# Patient Record
Sex: Male | Born: 1960
Health system: Southern US, Community
[De-identification: ages and names within clinical notes are randomized; demographics above are authoritative.]

## PROBLEM LIST (undated history)

## (undated) DIAGNOSIS — Z789 Other specified health status: Secondary | ICD-10-CM

## (undated) HISTORY — PX: APPENDECTOMY: SHX54

---

## 2013-09-26 ENCOUNTER — Emergency Department: Payer: Self-pay | Admitting: Emergency Medicine

## 2013-09-27 LAB — COMPREHENSIVE METABOLIC PANEL
Albumin: 3.5 g/dL (ref 3.4–5.0)
Anion Gap: 8 (ref 7–16)
Bilirubin,Total: 0.3 mg/dL (ref 0.2–1.0)
Calcium, Total: 8.3 mg/dL — ABNORMAL LOW (ref 8.5–10.1)
Chloride: 112 mmol/L — ABNORMAL HIGH (ref 98–107)
EGFR (African American): >60
EGFR (Non-African Amer.): >60
Glucose: 83 mg/dL (ref 65–99)
Osmolality: 280 (ref 275–301)
Potassium: 3.6 mmol/L (ref 3.5–5.1)
SGOT(AST): 84 U/L — ABNORMAL HIGH (ref 15–37)
SGPT (ALT): 78 U/L (ref 12–78)
Total Protein: 7 g/dL (ref 6.4–8.2)

## 2013-09-27 LAB — CBC
HCT: 43.3 % (ref 40.0–52.0)
HGB: 15.3 g/dL (ref 13.0–18.0)
MCH: 32.1 pg (ref 26.0–34.0)
MCV: 91 fL (ref 80–100)
Platelet: 235 10*3/uL (ref 150–440)
RBC: 4.75 10*6/uL (ref 4.40–5.90)
RDW: 12.3 % (ref 11.5–14.5)
WBC: 5.7 10*3/uL (ref 3.8–10.6)

## 2013-09-27 LAB — ETHANOL
Ethanol %: 0.218 % — ABNORMAL HIGH (ref 0.000–0.080)
Ethanol: 218 mg/dL

## 2016-01-21 ENCOUNTER — Emergency Department: Payer: Self-pay

## 2016-01-21 ENCOUNTER — Emergency Department
Admission: EM | Admit: 2016-01-21 | Discharge: 2016-01-21 | Disposition: A | Discharge status: Home / Self Care | Payer: Self-pay | Attending: Emergency Medicine | Admitting: Emergency Medicine

## 2016-01-21 ENCOUNTER — Encounter: Payer: Self-pay | Admitting: Emergency Medicine

## 2016-01-21 DIAGNOSIS — Y92410 Unspecified street and highway as the place of occurrence of the external cause: Secondary | ICD-10-CM | POA: Insufficient documentation

## 2016-01-21 DIAGNOSIS — S0990XA Unspecified injury of head, initial encounter: Secondary | ICD-10-CM | POA: Insufficient documentation

## 2016-01-21 DIAGNOSIS — F17200 Nicotine dependence, unspecified, uncomplicated: Secondary | ICD-10-CM | POA: Insufficient documentation

## 2016-01-21 DIAGNOSIS — M87059 Idiopathic aseptic necrosis of unspecified femur: Secondary | ICD-10-CM | POA: Insufficient documentation

## 2016-01-21 DIAGNOSIS — Y9389 Activity, other specified: Secondary | ICD-10-CM | POA: Insufficient documentation

## 2016-01-21 DIAGNOSIS — Y998 Other external cause status: Secondary | ICD-10-CM | POA: Insufficient documentation

## 2016-01-21 DIAGNOSIS — S3993XA Unspecified injury of pelvis, initial encounter: Secondary | ICD-10-CM | POA: Insufficient documentation

## 2016-01-21 DIAGNOSIS — S79912A Unspecified injury of left hip, initial encounter: Secondary | ICD-10-CM | POA: Insufficient documentation

## 2016-01-21 DIAGNOSIS — S8012XA Contusion of left lower leg, initial encounter: Secondary | ICD-10-CM | POA: Insufficient documentation

## 2016-01-21 MED ORDER — IBUPROFEN 800 MG PO TABS: 800.0000 mg | ORAL_TABLET | Freq: Three times a day (TID) | ORAL | Status: DC | PRN

## 2016-01-21 MED ORDER — OXYCODONE-ACETAMINOPHEN 5-325 MG PO TABS
1.0000 | ORAL_TABLET | Freq: Once | ORAL | Status: AC
  Administered 2016-01-21: 1 via ORAL
  Filled 2016-01-21: qty 1

## 2016-01-21 MED ORDER — IBUPROFEN 800 MG PO TABS
800.0000 mg | ORAL_TABLET | Freq: Once | ORAL | Status: AC
  Administered 2016-01-21: 800 mg via ORAL
  Filled 2016-01-21: qty 1

## 2016-01-21 MED ORDER — TRAMADOL HCL 50 MG PO TABS: 50.0000 mg | ORAL_TABLET | Freq: Four times a day (QID) | ORAL | Status: DC | PRN

## 2016-01-21 MED ORDER — CYCLOBENZAPRINE HCL 10 MG PO TABS: 10.0000 mg | ORAL_TABLET | Freq: Three times a day (TID) | ORAL | Status: AC | PRN
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
## 2018-12-28 ENCOUNTER — Encounter: Payer: Self-pay | Admitting: Student

## 2019-01-03 ENCOUNTER — Other Ambulatory Visit: Payer: Self-pay

## 2019-01-03 DIAGNOSIS — Z1211 Encounter for screening for malignant neoplasm of colon: Secondary | ICD-10-CM

## 2019-01-17 ENCOUNTER — Other Ambulatory Visit: Payer: Self-pay

## 2019-01-17 ENCOUNTER — Encounter: Payer: Self-pay | Admitting: Anesthesiology

## 2019-01-17 ENCOUNTER — Telehealth: Payer: Self-pay | Admitting: Gastroenterology

## 2019-01-17 ENCOUNTER — Encounter: Payer: Self-pay | Admitting: *Deleted

## 2019-01-17 NOTE — Telephone Encounter (Signed)
Please call patient and go over instructions for the colonoscopy scheduled for 01-24-2019 with Dr Hoy FinlayaHALIANI.

## 2019-01-18 NOTE — Telephone Encounter (Signed)
Returned patients call to review his colonoscopy instructions.  He specifically asked about nutrition.  I asked him to pull out his instruction packet he received in the mail and  refer to the "Low Fiber Diet" page of his instructions and we will review it together.  He stopped talking to me...  I wanted to confirm his address if he did not receive the instructions but he never said a word our call was dropped.  Thanks [employer]

## 2019-01-23 NOTE — Discharge Instructions (Signed)
General Anesthesia, Adult, Care After  This sheet gives you information about how to care for yourself after your procedure. Your health care provider may also give you more specific instructions. If you have problems or questions, contact your health care provider.  What can I expect after the procedure?  After the procedure, the following side effects are common:  Pain or discomfort at the IV site.  Nausea.  Vomiting.  Sore throat.  Trouble concentrating.  Feeling cold or chills.  Weak or tired.  Sleepiness and fatigue.  Soreness and body aches. These side effects can affect parts of the body that were not involved in surgery.  Follow these instructions at home:    For at least 24 hours after the procedure:  Have a responsible adult stay with you. It is important to have someone help care for you until you are awake and alert.  Rest as needed.  Do not:  Participate in activities in which you could fall or become injured.  Drive.  Use heavy machinery.  Drink alcohol.  Take sleeping pills or medicines that cause drowsiness.  Make important decisions or sign legal documents.  Take care of children on your own.  Eating and drinking  Follow any instructions from your health care provider about eating or drinking restrictions.  When you feel hungry, start by eating small amounts of foods that are soft and easy to digest (bland), such as toast. Gradually return to your regular diet.  Drink enough fluid to keep your urine pale yellow.  If you vomit, rehydrate by drinking water, juice, or clear broth.  General instructions  If you have sleep apnea, surgery and certain medicines can increase your risk for breathing problems. Follow instructions from your health care provider about wearing your sleep device:  Anytime you are sleeping, including during daytime naps.  While taking prescription pain medicines, sleeping medicines, or medicines that make you drowsy.  Return to your normal activities as told by your health care  provider. Ask your health care provider what activities are safe for you.  Take over-the-counter and prescription medicines only as told by your health care provider.  If you smoke, do not smoke without supervision.  Keep all follow-up visits as told by your health care provider. This is important.  Contact a health care provider if:  You have nausea or vomiting that does not get better with medicine.  You cannot eat or drink without vomiting.  You have pain that does not get better with medicine.  You are unable to pass urine.  You develop a skin rash.  You have a fever.  You have redness around your IV site that gets worse.  Get help right away if:  You have difficulty breathing.  You have chest pain.  You have blood in your urine or stool, or you vomit blood.  Summary  After the procedure, it is common to have a sore throat or nausea. It is also common to feel tired.  Have a responsible adult stay with you for the first 24 hours after general anesthesia. It is important to have someone help care for you until you are awake and alert.  When you feel hungry, start by eating small amounts of foods that are soft and easy to digest (bland), such as toast. Gradually return to your regular diet.  Drink enough fluid to keep your urine pale yellow.  Return to your normal activities as told by your health care provider. Ask your health care   provider what activities are safe for you.  This information is not intended to replace advice given to you by your health care provider. Make sure you discuss any questions you have with your health care provider.  Document Released: 03/08/2001 Document Revised: 07/16/2017 Document Reviewed: 07/16/2017  Elsevier Interactive Patient Education  2019 [employer]

## 2019-01-24 ENCOUNTER — Ambulatory Visit: Admission: RE | Admit: 2019-01-24 | Payer: Self-pay | Source: Home / Self Care | Admitting: Gastroenterology

## 2019-01-24 HISTORY — DX: Other specified health status: Z78.9

## 2019-01-24 SURGERY — COLONOSCOPY WITH PROPOFOL
Anesthesia: Choice

## 2020-07-30 ENCOUNTER — Emergency Department (HOSPITAL_COMMUNITY): Payer: Medicaid Other

## 2020-07-30 ENCOUNTER — Inpatient Hospital Stay (HOSPITAL_COMMUNITY)
Admission: EM | Admit: 2020-07-30 | Discharge: 2020-08-01 | DRG: 464 | Disposition: A | Discharge status: Home / Self Care | Payer: Medicaid Other | Attending: General Surgery | Admitting: General Surgery

## 2020-07-30 ENCOUNTER — Other Ambulatory Visit: Payer: Self-pay

## 2020-07-30 DIAGNOSIS — F10129 Alcohol abuse with intoxication, unspecified: Secondary | ICD-10-CM | POA: Diagnosis present

## 2020-07-30 DIAGNOSIS — Y92410 Unspecified street and highway as the place of occurrence of the external cause: Secondary | ICD-10-CM

## 2020-07-30 DIAGNOSIS — M79605 Pain in left leg: Secondary | ICD-10-CM | POA: Diagnosis present

## 2020-07-30 DIAGNOSIS — Z23 Encounter for immunization: Secondary | ICD-10-CM | POA: Diagnosis not present

## 2020-07-30 DIAGNOSIS — S2242XA Multiple fractures of ribs, left side, initial encounter for closed fracture: Secondary | ICD-10-CM | POA: Diagnosis present

## 2020-07-30 DIAGNOSIS — S82402B Unspecified fracture of shaft of left fibula, initial encounter for open fracture type I or II: Secondary | ICD-10-CM

## 2020-07-30 DIAGNOSIS — S81811A Laceration without foreign body, right lower leg, initial encounter: Secondary | ICD-10-CM

## 2020-07-30 DIAGNOSIS — F17210 Nicotine dependence, cigarettes, uncomplicated: Secondary | ICD-10-CM | POA: Diagnosis present

## 2020-07-30 DIAGNOSIS — Y9355 Activity, bike riding: Secondary | ICD-10-CM

## 2020-07-30 DIAGNOSIS — D62 Acute posthemorrhagic anemia: Secondary | ICD-10-CM | POA: Diagnosis not present

## 2020-07-30 DIAGNOSIS — Z20822 Contact with and (suspected) exposure to covid-19: Secondary | ICD-10-CM | POA: Diagnosis present

## 2020-07-30 DIAGNOSIS — S82202B Unspecified fracture of shaft of left tibia, initial encounter for open fracture type I or II: Secondary | ICD-10-CM

## 2020-07-30 DIAGNOSIS — T1490XA Injury, unspecified, initial encounter: Secondary | ICD-10-CM

## 2020-07-30 DIAGNOSIS — S82102B Unspecified fracture of upper end of left tibia, initial encounter for open fracture type I or II: Secondary | ICD-10-CM | POA: Diagnosis present

## 2020-07-30 DIAGNOSIS — S01511A Laceration without foreign body of lip, initial encounter: Secondary | ICD-10-CM

## 2020-07-30 DIAGNOSIS — S2249XA Multiple fractures of ribs, unspecified side, initial encounter for closed fracture: Secondary | ICD-10-CM

## 2020-07-30 DIAGNOSIS — I959 Hypotension, unspecified: Secondary | ICD-10-CM | POA: Diagnosis present

## 2020-07-30 DIAGNOSIS — S82262B Displaced segmental fracture of shaft of left tibia, initial encounter for open fracture type I or II: Secondary | ICD-10-CM | POA: Diagnosis present

## 2020-07-30 DIAGNOSIS — S82462B Displaced segmental fracture of shaft of left fibula, initial encounter for open fracture type I or II: Secondary | ICD-10-CM | POA: Diagnosis present

## 2020-07-30 DIAGNOSIS — T148XXA Other injury of unspecified body region, initial encounter: Secondary | ICD-10-CM

## 2020-07-30 DIAGNOSIS — Z419 Encounter for procedure for purposes other than remedying health state, unspecified: Secondary | ICD-10-CM

## 2020-07-30 LAB — CBC
HCT: 39.6 % (ref 39.0–52.0)
HCT: 37.3 % — ABNORMAL LOW (ref 39.0–52.0)
Hemoglobin: 13.5 g/dL (ref 13.0–17.0)
Hemoglobin: 13 g/dL (ref 13.0–17.0)
MCH: 31.3 pg (ref 26.0–34.0)
MCH: 31.4 pg (ref 26.0–34.0)
MCHC: 34.1 g/dL (ref 30.0–36.0)
MCHC: 34.9 g/dL (ref 30.0–36.0)
MCV: 91.7 fL (ref 80.0–100.0)
MCV: 90.1 fL (ref 80.0–100.0)
Platelets: 244 10*3/uL (ref 150–400)
Platelets: 218 10*3/uL (ref 150–400)
RBC: 4.32 MIL/uL (ref 4.22–5.81)
RBC: 4.14 MIL/uL — ABNORMAL LOW (ref 4.22–5.81)
RDW: 12.5 % (ref 11.5–15.5)
RDW: 12.4 % (ref 11.5–15.5)
WBC: 6.8 10*3/uL (ref 4.0–10.5)
WBC: 14.5 10*3/uL — ABNORMAL HIGH (ref 4.0–10.5)
nRBC: 0 % (ref 0.0–0.2)
nRBC: 0 % (ref 0.0–0.2)

## 2020-07-30 LAB — COMPREHENSIVE METABOLIC PANEL
ALT: 58 U/L — ABNORMAL HIGH (ref 0–44)
ALT: 58 U/L — ABNORMAL HIGH (ref 0–44)
AST: 99 U/L — ABNORMAL HIGH (ref 15–41)
AST: 80 U/L — ABNORMAL HIGH (ref 15–41)
Albumin: 3.5 g/dL (ref 3.5–5.0)
Albumin: 3.5 g/dL (ref 3.5–5.0)
Alkaline Phosphatase: 55 U/L (ref 38–126)
Alkaline Phosphatase: 56 U/L (ref 38–126)
Anion gap: 13 (ref 5–15)
Anion gap: 10 (ref 5–15)
BUN: 9 mg/dL (ref 6–20)
BUN: 8 mg/dL (ref 6–20)
CO2: 17 mmol/L — ABNORMAL LOW (ref 22–32)
CO2: 18 mmol/L — ABNORMAL LOW (ref 22–32)
Calcium: 8.6 mg/dL — ABNORMAL LOW (ref 8.9–10.3)
Calcium: 8.7 mg/dL — ABNORMAL LOW (ref 8.9–10.3)
Chloride: 104 mmol/L (ref 98–111)
Chloride: 107 mmol/L (ref 98–111)
Creatinine, Ser: 1.16 mg/dL (ref 0.61–1.24)
Creatinine, Ser: 1.06 mg/dL (ref 0.61–1.24)
GFR calc Af Amer: >60 mL/min (ref >60)
GFR calc Af Amer: >60 mL/min (ref >60)
GFR calc non Af Amer: >60 mL/min (ref >60)
GFR calc non Af Amer: >60 mL/min (ref >60)
Glucose, Bld: 96 mg/dL (ref 70–99)
Glucose, Bld: 159 mg/dL — ABNORMAL HIGH (ref 70–99)
Potassium: 3.2 mmol/L — ABNORMAL LOW (ref 3.5–5.1)
Potassium: 3.9 mmol/L (ref 3.5–5.1)
Sodium: 134 mmol/L — ABNORMAL LOW (ref 135–145)
Sodium: 135 mmol/L (ref 135–145)
Total Bilirubin: 0.9 mg/dL (ref 0.3–1.2)
Total Bilirubin: 0.8 mg/dL (ref 0.3–1.2)
Total Protein: 6.4 g/dL — ABNORMAL LOW (ref 6.5–8.1)
Total Protein: 6.5 g/dL (ref 6.5–8.1)

## 2020-07-30 LAB — MAGNESIUM: Magnesium: 1.5 mg/dL — ABNORMAL LOW (ref 1.7–2.4)

## 2020-07-30 LAB — PROTIME-INR
INR: 1 (ref 0.8–1.2)
Prothrombin Time: 13.1 seconds (ref 11.4–15.2)

## 2020-07-30 LAB — LACTIC ACID, PLASMA: Lactic Acid, Venous: 4.1 mmol/L (ref 0.5–1.9)

## 2020-07-30 LAB — I-STAT CHEM 8, ED
BUN: 8 mg/dL (ref 6–20)
Calcium, Ion: 1.11 mmol/L — ABNORMAL LOW (ref 1.15–1.40)
Chloride: 106 mmol/L (ref 98–111)
Creatinine, Ser: 1.2 mg/dL (ref 0.61–1.24)
Glucose, Bld: 90 mg/dL (ref 70–99)
HCT: 43 % (ref 39.0–52.0)
Hemoglobin: 14.6 g/dL (ref 13.0–17.0)
Potassium: 3.3 mmol/L — ABNORMAL LOW (ref 3.5–5.1)
Sodium: 142 mmol/L (ref 135–145)
TCO2: 18 mmol/L — ABNORMAL LOW (ref 22–32)

## 2020-07-30 LAB — SAMPLE TO BLOOD BANK

## 2020-07-30 LAB — SARS CORONAVIRUS 2 BY RT PCR (HOSPITAL ORDER, PERFORMED IN ~~LOC~~ HOSPITAL LAB): SARS Coronavirus 2: NEGATIVE

## 2020-07-30 LAB — HIV ANTIBODY (ROUTINE TESTING W REFLEX): HIV Screen 4th Generation wRfx: NONREACTIVE

## 2020-07-30 LAB — PHOSPHORUS: Phosphorus: 3.2 mg/dL (ref 2.5–4.6)

## 2020-07-30 LAB — ETHANOL: Alcohol, Ethyl (B): 108 mg/dL — ABNORMAL HIGH (ref <10)

## 2020-07-30 MED ORDER — ACETAMINOPHEN 325 MG PO TABS: 650.0000 mg | ORAL_TABLET | ORAL | Status: DC | PRN

## 2020-07-30 MED ORDER — OXYCODONE HCL 5 MG PO TABS
10.0000 mg | ORAL_TABLET | ORAL | Status: DC | PRN
  Administered 2020-07-30 – 2020-07-31 (×3): 10 mg via ORAL
  Filled 2020-07-30 (×3): qty 2

## 2020-07-30 MED ORDER — THIAMINE HCL 100 MG PO TABS
100.0000 mg | ORAL_TABLET | Freq: Every day | ORAL | Status: DC
  Administered 2020-08-01: 100 mg via ORAL
  Filled 2020-07-30: qty 1

## 2020-07-30 MED ORDER — CEFAZOLIN SODIUM-DEXTROSE 2-4 GM/100ML-% IV SOLN: 2.0000 g | Freq: Three times a day (TID) | INTRAVENOUS | Status: DC

## 2020-07-30 MED ORDER — HYDROMORPHONE HCL 1 MG/ML IJ SOLN
1.0000 mg | INTRAMUSCULAR | Status: DC | PRN
  Administered 2020-07-30 – 2020-08-01 (×2): 1 mg via INTRAVENOUS
  Filled 2020-07-30 (×2): qty 1

## 2020-07-30 MED ORDER — THIAMINE HCL 100 MG/ML IJ SOLN
100.0000 mg | Freq: Every day | INTRAMUSCULAR | Status: DC
  Administered 2020-07-31: 100 mg via INTRAVENOUS
  Filled 2020-07-30: qty 2

## 2020-07-30 MED ORDER — MORPHINE SULFATE (PF) 4 MG/ML IV SOLN
4.0000 mg | Freq: Once | INTRAVENOUS | Status: AC
  Administered 2020-07-30: 4 mg via INTRAVENOUS
  Filled 2020-07-30: qty 1

## 2020-07-30 MED ORDER — ADULT MULTIVITAMIN W/MINERALS CH
1.0000 | ORAL_TABLET | Freq: Every day | ORAL | Status: DC
  Administered 2020-08-01: 1 via ORAL
  Filled 2020-07-30: qty 1

## 2020-07-30 MED ORDER — ONDANSETRON 4 MG PO TBDP: 4.0000 mg | ORAL_TABLET | Freq: Four times a day (QID) | ORAL | Status: DC | PRN

## 2020-07-30 MED ORDER — TETANUS-DIPHTH-ACELL PERTUSSIS 5-2.5-18.5 LF-MCG/0.5 IM SUSP
0.5000 mL | Freq: Once | INTRAMUSCULAR | Status: AC
  Administered 2020-07-30: 0.5 mL via INTRAMUSCULAR
  Filled 2020-07-30: qty 0.5

## 2020-07-30 MED ORDER — POVIDONE-IODINE 10 % EX SWAB
2.0000 "application " | Freq: Once | CUTANEOUS | Status: AC
  Administered 2020-07-30: 2 via TOPICAL

## 2020-07-30 MED ORDER — LORAZEPAM 2 MG/ML IJ SOLN: 0.0000 mg | Freq: Two times a day (BID) | INTRAMUSCULAR | Status: DC

## 2020-07-30 MED ORDER — CEFAZOLIN SODIUM-DEXTROSE 2-4 GM/100ML-% IV SOLN
2.0000 g | Freq: Three times a day (TID) | INTRAVENOUS | Status: DC
  Administered 2020-07-31 (×2): 2 g via INTRAVENOUS
  Filled 2020-07-30 (×2): qty 100

## 2020-07-30 MED ORDER — METHOCARBAMOL 1000 MG/10ML IJ SOLN
1000.0000 mg | Freq: Three times a day (TID) | INTRAVENOUS | Status: DC | PRN
  Filled 2020-07-30: qty 10

## 2020-07-30 MED ORDER — CEFAZOLIN SODIUM-DEXTROSE 2-4 GM/100ML-% IV SOLN: 2.0000 g | INTRAVENOUS | Status: DC

## 2020-07-30 MED ORDER — FOLIC ACID 1 MG PO TABS
1.0000 mg | ORAL_TABLET | Freq: Every day | ORAL | Status: DC
  Administered 2020-08-01: 1 mg via ORAL
  Filled 2020-07-30: qty 1

## 2020-07-30 MED ORDER — METOPROLOL TARTRATE 5 MG/5ML IV SOLN: 5.0000 mg | Freq: Four times a day (QID) | INTRAVENOUS | Status: DC | PRN

## 2020-07-30 MED ORDER — LORAZEPAM 1 MG PO TABS
1.0000 mg | ORAL_TABLET | ORAL | Status: DC | PRN
  Filled 2020-07-30: qty 2

## 2020-07-30 MED ORDER — LIDOCAINE-EPINEPHRINE 1 %-1:100000 IJ SOLN
10.0000 mL | Freq: Once | INTRAMUSCULAR | Status: DC
  Filled 2020-07-30: qty 1

## 2020-07-30 MED ORDER — LORAZEPAM 2 MG/ML IJ SOLN: 1.0000 mg | INTRAMUSCULAR | Status: DC | PRN

## 2020-07-30 MED ORDER — CHLORHEXIDINE GLUCONATE 4 % EX LIQD
60.0000 mL | Freq: Once | CUTANEOUS | Status: AC
  Administered 2020-07-30: 4 via TOPICAL

## 2020-07-30 MED ORDER — ENOXAPARIN SODIUM 40 MG/0.4ML ~~LOC~~ SOLN: 40.0000 mg | Freq: Two times a day (BID) | SUBCUTANEOUS | Status: DC

## 2020-07-30 MED ORDER — ONDANSETRON HCL 4 MG/2ML IJ SOLN: 4.0000 mg | Freq: Four times a day (QID) | INTRAMUSCULAR | Status: DC | PRN

## 2020-07-30 MED ORDER — POTASSIUM CHLORIDE IN NACL 20-0.9 MEQ/L-% IV SOLN
INTRAVENOUS | Status: DC
  Filled 2020-07-30 (×5): qty 1000

## 2020-07-30 MED ORDER — LORAZEPAM 2 MG/ML IJ SOLN
0.0000 mg | Freq: Four times a day (QID) | INTRAMUSCULAR | Status: DC
  Administered 2020-07-30: 2 mg via INTRAVENOUS

## 2020-07-30 MED ORDER — IOHEXOL 300 MG/ML  SOLN
100.0000 mL | Freq: Once | INTRAMUSCULAR | Status: AC | PRN
  Administered 2020-07-30: 100 mL via INTRAVENOUS

## 2020-07-30 MED ORDER — MORPHINE SULFATE (PF) 4 MG/ML IV SOLN
INTRAVENOUS | Status: AC
  Administered 2020-07-30: 4 mg
  Filled 2020-07-30: qty 1

## 2020-07-30 MED ORDER — OXYCODONE HCL 5 MG PO TABS
5.0000 mg | ORAL_TABLET | ORAL | Status: DC | PRN
  Administered 2020-07-31 (×2): 5 mg via ORAL
  Filled 2020-07-30 (×3): qty 1

## 2020-07-30 MED ORDER — GABAPENTIN 100 MG PO CAPS
100.0000 mg | ORAL_CAPSULE | Freq: Three times a day (TID) | ORAL | Status: DC
  Administered 2020-07-30 – 2020-08-01 (×5): 100 mg via ORAL
  Filled 2020-07-30 (×4): qty 1

## 2020-07-30 MED ORDER — CEFAZOLIN SODIUM-DEXTROSE 2-4 GM/100ML-% IV SOLN
2.0000 g | Freq: Once | INTRAVENOUS | Status: AC
  Administered 2020-07-30: 2 g via INTRAVENOUS
  Filled 2020-07-30: qty 100
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
## 2020-07-30 NOTE — Progress Notes (Signed)
Orthopedic Tech Progress Note Patient Details:  Jack Page February 07, 1961 [id number] Level 1 Trauma Downgrade to Level 2 Trauma Ortho Devices Type of Ortho Device: Short leg splint, Stirrup splint Ortho Device/Splint Location: Left Lower Extremity Ortho Device/Splint Interventions: Ordered, Application   Post Interventions Patient Tolerated: Well Instructions Provided: Adjustment of device, Care of device, Poper ambulation with device   [name] Harle Stanford 07/30/2020, 5:53 PM
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
## 2020-07-30 NOTE — ED Notes (Signed)
Attempted report x1. 

## 2020-07-31 ENCOUNTER — Inpatient Hospital Stay (HOSPITAL_COMMUNITY): Payer: Medicaid Other | Admitting: Registered Nurse

## 2020-07-31 ENCOUNTER — Inpatient Hospital Stay (HOSPITAL_COMMUNITY): Payer: Medicaid Other

## 2020-07-31 ENCOUNTER — Encounter (HOSPITAL_COMMUNITY): Admission: EM | Disposition: A | Discharge status: Home / Self Care | Payer: Self-pay | Source: Home / Self Care

## 2020-07-31 ENCOUNTER — Encounter (HOSPITAL_COMMUNITY): Payer: Self-pay | Admitting: General Surgery

## 2020-07-31 HISTORY — PX: TIBIA IM NAIL INSERTION: SHX2516

## 2020-07-31 HISTORY — PX: I & D EXTREMITY: SHX5045

## 2020-07-31 LAB — CBC
HCT: 36.7 % — ABNORMAL LOW (ref 39.0–52.0)
Hemoglobin: 12.6 g/dL — ABNORMAL LOW (ref 13.0–17.0)
MCH: 31 pg (ref 26.0–34.0)
MCHC: 34.3 g/dL (ref 30.0–36.0)
MCV: 90.2 fL (ref 80.0–100.0)
Platelets: 208 10*3/uL (ref 150–400)
RBC: 4.07 MIL/uL — ABNORMAL LOW (ref 4.22–5.81)
RDW: 12.4 % (ref 11.5–15.5)
WBC: 12 10*3/uL — ABNORMAL HIGH (ref 4.0–10.5)
nRBC: 0 % (ref 0.0–0.2)

## 2020-07-31 LAB — BASIC METABOLIC PANEL
Anion gap: 8 (ref 5–15)
BUN: 9 mg/dL (ref 6–20)
CO2: 22 mmol/L (ref 22–32)
Calcium: 8.7 mg/dL — ABNORMAL LOW (ref 8.9–10.3)
Chloride: 106 mmol/L (ref 98–111)
Creatinine, Ser: 1.14 mg/dL (ref 0.61–1.24)
GFR calc Af Amer: >60 mL/min (ref >60)
GFR calc non Af Amer: >60 mL/min (ref >60)
Glucose, Bld: 112 mg/dL — ABNORMAL HIGH (ref 70–99)
Potassium: 4.2 mmol/L (ref 3.5–5.1)
Sodium: 136 mmol/L (ref 135–145)

## 2020-07-31 LAB — SURGICAL PCR SCREEN
MRSA, PCR: NEGATIVE
Staphylococcus aureus: NEGATIVE

## 2020-07-31 LAB — LACTIC ACID, PLASMA: Lactic Acid, Venous: 0.8 mmol/L (ref 0.5–1.9)

## 2020-07-31 SURGERY — INSERTION, INTRAMEDULLARY ROD, TIBIA
Anesthesia: General | Laterality: Left

## 2020-07-31 MED ORDER — PROPOFOL 10 MG/ML IV BOLUS
INTRAVENOUS | Status: DC | PRN
  Administered 2020-07-31: 140 mg via INTRAVENOUS

## 2020-07-31 MED ORDER — ORAL CARE MOUTH RINSE: 15.0000 mL | Freq: Once | OROMUCOSAL | Status: AC

## 2020-07-31 MED ORDER — ONDANSETRON HCL 4 MG/2ML IJ SOLN
INTRAMUSCULAR | Status: AC
  Filled 2020-07-31: qty 2

## 2020-07-31 MED ORDER — ONDANSETRON HCL 4 MG/2ML IJ SOLN
INTRAMUSCULAR | Status: DC | PRN
  Administered 2020-07-31: 4 mg via INTRAVENOUS

## 2020-07-31 MED ORDER — ROCURONIUM BROMIDE 10 MG/ML (PF) SYRINGE
PREFILLED_SYRINGE | INTRAVENOUS | Status: DC | PRN
  Administered 2020-07-31: 50 mg via INTRAVENOUS

## 2020-07-31 MED ORDER — MIDAZOLAM HCL 2 MG/2ML IJ SOLN
INTRAMUSCULAR | Status: AC
  Filled 2020-07-31: qty 2

## 2020-07-31 MED ORDER — METOCLOPRAMIDE HCL 5 MG/ML IJ SOLN: 5.0000 mg | Freq: Three times a day (TID) | INTRAMUSCULAR | Status: DC | PRN

## 2020-07-31 MED ORDER — PROPOFOL 10 MG/ML IV BOLUS
INTRAVENOUS | Status: AC
  Filled 2020-07-31: qty 20

## 2020-07-31 MED ORDER — METOCLOPRAMIDE HCL 5 MG PO TABS: 5.0000 mg | ORAL_TABLET | Freq: Three times a day (TID) | ORAL | Status: DC | PRN

## 2020-07-31 MED ORDER — SODIUM CHLORIDE 0.9 % IV SOLN
2.0000 g | INTRAVENOUS | Status: DC
  Administered 2020-07-31 – 2020-08-01 (×2): 2 g via INTRAVENOUS
  Filled 2020-07-31 (×2): qty 20

## 2020-07-31 MED ORDER — DOCUSATE SODIUM 100 MG PO CAPS
100.0000 mg | ORAL_CAPSULE | Freq: Two times a day (BID) | ORAL | Status: DC
  Administered 2020-07-31 – 2020-08-01 (×3): 100 mg via ORAL
  Filled 2020-07-31 (×3): qty 1

## 2020-07-31 MED ORDER — HYDROMORPHONE HCL 1 MG/ML IJ SOLN: 0.2500 mg | INTRAMUSCULAR | Status: DC | PRN

## 2020-07-31 MED ORDER — MIDAZOLAM HCL 5 MG/5ML IJ SOLN
INTRAMUSCULAR | Status: DC | PRN
  Administered 2020-07-31: 2 mg via INTRAVENOUS

## 2020-07-31 MED ORDER — TOBRAMYCIN SULFATE 1.2 G IJ SOLR
INTRAMUSCULAR | Status: DC | PRN
  Administered 2020-07-31: 1.2 g via TOPICAL

## 2020-07-31 MED ORDER — DEXAMETHASONE SODIUM PHOSPHATE 10 MG/ML IJ SOLN
INTRAMUSCULAR | Status: DC | PRN
  Administered 2020-07-31: 5 mg via INTRAVENOUS

## 2020-07-31 MED ORDER — LIDOCAINE 2% (20 MG/ML) 5 ML SYRINGE
INTRAMUSCULAR | Status: AC
  Filled 2020-07-31: qty 5

## 2020-07-31 MED ORDER — ENOXAPARIN SODIUM 40 MG/0.4ML ~~LOC~~ SOLN
40.0000 mg | Freq: Two times a day (BID) | SUBCUTANEOUS | Status: DC
  Administered 2020-08-01: 40 mg via SUBCUTANEOUS
  Filled 2020-07-31: qty 0.4

## 2020-07-31 MED ORDER — LACTATED RINGERS IV SOLN: INTRAVENOUS | Status: DC

## 2020-07-31 MED ORDER — VANCOMYCIN HCL 1000 MG IV SOLR
INTRAVENOUS | Status: DC | PRN
  Administered 2020-07-31: 1000 mg via TOPICAL

## 2020-07-31 MED ORDER — DEXAMETHASONE SODIUM PHOSPHATE 10 MG/ML IJ SOLN
INTRAMUSCULAR | Status: AC
  Filled 2020-07-31: qty 1

## 2020-07-31 MED ORDER — CHLORHEXIDINE GLUCONATE 0.12 % MT SOLN: 15.0000 mL | Freq: Once | OROMUCOSAL | Status: AC

## 2020-07-31 MED ORDER — SUCCINYLCHOLINE CHLORIDE 200 MG/10ML IV SOSY
PREFILLED_SYRINGE | INTRAVENOUS | Status: AC
  Filled 2020-07-31: qty 10

## 2020-07-31 MED ORDER — CHLORHEXIDINE GLUCONATE 0.12 % MT SOLN
OROMUCOSAL | Status: AC
  Administered 2020-07-31: 15 mL via OROMUCOSAL
  Filled 2020-07-31: qty 15

## 2020-07-31 MED ORDER — ONDANSETRON HCL 4 MG/2ML IJ SOLN: 4.0000 mg | Freq: Four times a day (QID) | INTRAMUSCULAR | Status: DC | PRN

## 2020-07-31 MED ORDER — SUGAMMADEX SODIUM 200 MG/2ML IV SOLN
INTRAVENOUS | Status: DC | PRN
  Administered 2020-07-31 (×2): 100 mg via INTRAVENOUS

## 2020-07-31 MED ORDER — VANCOMYCIN HCL 1000 MG IV SOLR
INTRAVENOUS | Status: AC
  Filled 2020-07-31: qty 1000

## 2020-07-31 MED ORDER — KETOROLAC TROMETHAMINE 15 MG/ML IJ SOLN
15.0000 mg | Freq: Four times a day (QID) | INTRAMUSCULAR | Status: AC
  Administered 2020-07-31 – 2020-08-01 (×3): 15 mg via INTRAVENOUS
  Filled 2020-07-31 (×3): qty 1

## 2020-07-31 MED ORDER — POLYETHYLENE GLYCOL 3350 17 G PO PACK
17.0000 g | PACK | Freq: Every day | ORAL | Status: DC | PRN
  Administered 2020-08-01: 17 g via ORAL
  Filled 2020-07-31: qty 1

## 2020-07-31 MED ORDER — SUCCINYLCHOLINE CHLORIDE 200 MG/10ML IV SOSY
PREFILLED_SYRINGE | INTRAVENOUS | Status: DC | PRN
  Administered 2020-07-31: 100 mg via INTRAVENOUS

## 2020-07-31 MED ORDER — SODIUM CHLORIDE 0.9 % IR SOLN
Status: DC | PRN
  Administered 2020-07-31: 9000 mL

## 2020-07-31 MED ORDER — ROCURONIUM BROMIDE 10 MG/ML (PF) SYRINGE
PREFILLED_SYRINGE | INTRAVENOUS | Status: AC
  Filled 2020-07-31: qty 10

## 2020-07-31 MED ORDER — FENTANYL CITRATE (PF) 250 MCG/5ML IJ SOLN
INTRAMUSCULAR | Status: DC | PRN
  Administered 2020-07-31 (×5): 50 ug via INTRAVENOUS

## 2020-07-31 MED ORDER — ONDANSETRON HCL 4 MG PO TABS: 4.0000 mg | ORAL_TABLET | Freq: Four times a day (QID) | ORAL | Status: DC | PRN

## 2020-07-31 MED ORDER — FENTANYL CITRATE (PF) 250 MCG/5ML IJ SOLN
INTRAMUSCULAR | Status: AC
  Filled 2020-07-31: qty 5

## 2020-07-31 MED ORDER — TOBRAMYCIN SULFATE 1.2 G IJ SOLR
INTRAMUSCULAR | Status: AC
  Filled 2020-07-31: qty 1.2

## 2020-07-31 MED ORDER — LIDOCAINE 2% (20 MG/ML) 5 ML SYRINGE
INTRAMUSCULAR | Status: DC | PRN
  Administered 2020-07-31: 60 mg via INTRAVENOUS

## 2020-07-31 SURGICAL SUPPLY — 70 items
BIT DRILL CALIBRATED 4.2 (BIT) ×1 IMPLANT
BIT DRILL SHORT 3.2MM (DRILL) ×1 IMPLANT
BIT DRILL SHORT 4.2 (BIT) ×2 IMPLANT
BLADE SURG 10 STRL SS (BLADE) ×4 IMPLANT
BNDG COHESIVE 4X5 TAN STRL (GAUZE/BANDAGES/DRESSINGS) ×2 IMPLANT
BNDG ELASTIC 4X5.8 VLCR STR LF (GAUZE/BANDAGES/DRESSINGS) ×4 IMPLANT
BNDG ELASTIC 6X5.8 VLCR STR LF (GAUZE/BANDAGES/DRESSINGS) ×6 IMPLANT
BNDG GAUZE ELAST 4 BULKY (GAUZE/BANDAGES/DRESSINGS) ×2 IMPLANT
BRUSH SCRUB EZ PLAIN DRY (MISCELLANEOUS) ×4 IMPLANT
CANISTER WOUND CARE 500ML ATS (WOUND CARE) ×2 IMPLANT
CHLORAPREP W/TINT 26 (MISCELLANEOUS) ×2 IMPLANT
COVER SURGICAL LIGHT HANDLE (MISCELLANEOUS) ×4 IMPLANT
COVER WAND RF STERILE (DRAPES) ×2 IMPLANT
DRAPE C-ARM 42X72 X-RAY (DRAPES) ×2 IMPLANT
DRAPE C-ARMOR (DRAPES) ×2 IMPLANT
DRAPE HALF SHEET 40X57 (DRAPES) ×4 IMPLANT
DRAPE IMP U-DRAPE 54X76 (DRAPES) ×4 IMPLANT
DRAPE INCISE IOBAN 66X45 STRL (DRAPES) IMPLANT
DRAPE ORTHO SPLIT 77X108 STRL (DRAPES) ×2
DRAPE SURG ORHT 6 SPLT 77X108 (DRAPES) ×2 IMPLANT
DRAPE U-SHAPE 47X51 STRL (DRAPES) ×2 IMPLANT
DRILL BIT CALIBRATED 4.2 (BIT) ×2
DRILL BIT SHORT 4.2 (BIT) ×2
DRILL SHORT 3.2MM (DRILL) ×2
DRSG ADAPTIC 3X8 NADH LF (GAUZE/BANDAGES/DRESSINGS) ×2 IMPLANT
DRSG MEPITEL 4X7.2 (GAUZE/BANDAGES/DRESSINGS) ×2 IMPLANT
ELECT REM PT RETURN 9FT ADLT (ELECTROSURGICAL) ×2
ELECTRODE REM PT RTRN 9FT ADLT (ELECTROSURGICAL) ×1 IMPLANT
GAUZE SPONGE 4X4 12PLY STRL (GAUZE/BANDAGES/DRESSINGS) ×4 IMPLANT
GLOVE BIO SURGEON STRL SZ 6.5 (GLOVE) ×6 IMPLANT
GLOVE BIO SURGEON STRL SZ7.5 (GLOVE) ×8 IMPLANT
GLOVE BIOGEL PI IND STRL 6.5 (GLOVE) ×1 IMPLANT
GLOVE BIOGEL PI IND STRL 7.5 (GLOVE) ×1 IMPLANT
GLOVE BIOGEL PI INDICATOR 6.5 (GLOVE) ×1
GLOVE BIOGEL PI INDICATOR 7.5 (GLOVE) ×1
GOWN STRL REUS W/ TWL LRG LVL3 (GOWN DISPOSABLE) ×2 IMPLANT
GOWN STRL REUS W/TWL LRG LVL3 (GOWN DISPOSABLE) ×2
GUIDEWIRE 3.2X400 (WIRE) ×2 IMPLANT
IV NS IRRIG 3000ML ARTHROMATIC (IV SOLUTION) ×6 IMPLANT
KIT BASIN OR (CUSTOM PROCEDURE TRAY) ×2 IMPLANT
KIT PREVENA INCISION MGT 13 (CANNISTER) ×2 IMPLANT
KIT TURNOVER KIT B (KITS) ×2 IMPLANT
NAIL TIBIAL 10X375MM (Nail) ×2 IMPLANT
PACK TOTAL JOINT (CUSTOM PROCEDURE TRAY) ×2 IMPLANT
PAD ARMBOARD 7.5X6 YLW CONV (MISCELLANEOUS) ×4 IMPLANT
PADDING CAST ABS 4INX4YD NS (CAST SUPPLIES) ×1
PADDING CAST ABS 6INX4YD NS (CAST SUPPLIES) ×1
PADDING CAST ABS COTTON 4X4 ST (CAST SUPPLIES) ×1 IMPLANT
PADDING CAST ABS COTTON 6X4 NS (CAST SUPPLIES) ×1 IMPLANT
REAMER ROD DEEP FLUTE 2.5X950 (INSTRUMENTS) ×2 IMPLANT
SCREW LOCK STAR 5X32 (Screw) ×2 IMPLANT
SCREW LOCK STAR 5X34 (Screw) ×2 IMPLANT
SCREW LOCK STAR 5X42 (Screw) ×2 IMPLANT
SCREW LOCK STAR 5X60 (Screw) ×2 IMPLANT
SCREW LOCK STAR 5X70 (Screw) ×2 IMPLANT
SCREW LOCK TITANIUM 4.0X50 (Screw) ×2 IMPLANT
SCREW LOCKING 4.0 36MM (Screw) ×2 IMPLANT
SCREW LOCKING 4X38 (Screw) ×2 IMPLANT
SET INTERPULSE LAVAGE W/TIP (ORTHOPEDIC DISPOSABLE SUPPLIES) ×2 IMPLANT
STAPLER VISISTAT 35W (STAPLE) ×2 IMPLANT
SUT ETHILON 3 0 PS 1 (SUTURE) ×4 IMPLANT
SUT MNCRL AB 3-0 PS2 18 (SUTURE) ×2 IMPLANT
SUT MON AB 2-0 CT1 36 (SUTURE) ×2 IMPLANT
SUT VIC AB 0 CT1 27 (SUTURE)
SUT VIC AB 0 CT1 27XBRD ANBCTR (SUTURE) IMPLANT
SUT VIC AB 2-0 CT1 27 (SUTURE) ×1
SUT VIC AB 2-0 CT1 TAPERPNT 27 (SUTURE) ×1 IMPLANT
TOWEL GREEN STERILE (TOWEL DISPOSABLE) ×4 IMPLANT
TOWEL GREEN STERILE FF (TOWEL DISPOSABLE) ×2 IMPLANT
YANKAUER SUCT BULB TIP NO VENT (SUCTIONS) IMPLANT
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
## 2020-07-31 NOTE — Progress Notes (Signed)
[facility] Progress Note  Day of Surgery  Subjective: Patient reports pain in LLE and foot. He also reports some pain in L chest but denies SOB. Some mild L sided abdominal pain but reports feeling hungry and thirsty. States he is ready to go to the OR. He lives with his girlfriend, Gavin Pound and reports that she will be able to be home with him.   Objective: Vital signs in last 24 hours: Temp:  [97.1 F (36.2 C)-99.6 F (37.6 C)] 99 F (37.2 C) (08/18 0741) Pulse Rate:  [57-92] 77 (08/18 0741) Resp:  [16-20] 17 (08/18 0741) BP: (113-147)/(62-97) 147/97 (08/18 0741) SpO2:  [93 %-98 %] 93 % (08/18 0741) Weight:  [68 kg] 68 kg (08/17 1752) Last BM Date: 07/30/20  Intake/Output from previous day: 08/17 0701 - 08/18 0700 In: 1091.8 [P.O.:240; I.V.:651.8; IV Piggyback:200] Out: 750 [Urine:750] Intake/Output this shift: No intake/output data recorded.  PE: General: pleasant, WD, cachectic male who is laying in bed in clear discomfort HEENT: temporal wasting.  Sclera are noninjected.  PERRL.  Ears and nose without any masses or lesions.  Prolene sutures in upper lip Heart: regular, rate, and rhythm.  Normal s1,s2. No obvious murmurs, gallops, or rubs noted.  Palpable radial and pedal pulses bilaterally Lungs: CTAB, no wheezes, rhonchi, or rales noted.  Respiratory effort nonlabored Abd: soft, NT, ND, +BS, no masses, hernias, or organomegaly MS: LLE in splint, L toes NVI; RLE laceration c/d/i with sutures present, R foot NVI Skin: warm and dry with no masses, lesions, or rashes Neuro: Cranial nerves 2-12 grossly intact, following commands Psych: A&Ox4 with an appropriate affect.   Lab Results:  Recent Labs    07/30/20 2200 07/31/20 0308  WBC 14.5* 12.0*  HGB 13.0 12.6*  HCT 37.3* 36.7*  PLT 218 208   BMET Recent Labs    07/30/20 2200 07/31/20 0308  NA 135 136  K 3.9 4.2  CL 107 106  CO2 18* 22  GLUCOSE 159* 112*  BUN 8 9  CREATININE 1.06 1.14  CALCIUM  8.7* 8.7*   PT/INR Recent Labs    07/30/20 1738  LABPROT 13.1  INR 1.0   CMP     Component Value Date/Time   NA 136 07/31/2020 0308   K 4.2 07/31/2020 0308   CL 106 07/31/2020 0308   CO2 22 07/31/2020 0308   GLUCOSE 112 (H) 07/31/2020 0308   BUN 9 07/31/2020 0308   CREATININE 1.14 07/31/2020 0308   CALCIUM 8.7 (L) 07/31/2020 0308   PROT 6.5 07/30/2020 2200   ALBUMIN 3.5 07/30/2020 2200   AST 80 (H) 07/30/2020 2200   ALT 58 (H) 07/30/2020 2200   ALKPHOS 56 07/30/2020 2200   BILITOT 0.8 07/30/2020 2200   GFRNONAA >60 07/31/2020 0308   GFRAA >60 07/31/2020 0308   Lipase  No results found for: LIPASE     Studies/Results: DG Tibia/Fibula Left  Result Date: 07/30/2020 CLINICAL DATA:  Status post trauma. EXAM: LEFT TIBIA AND FIBULA - 2 VIEW COMPARISON:  None. FINDINGS: The left tibia and left fibula were imaged in a fiberglass cast with subsequently obscured osseous and soft tissue detail. Acute, comminuted fracture deformities are seen involving the proximal and mid portions of the left tibial shaft, as well as the proximal and mid portions of the shaft of the left fibula. Gross anatomic alignment is seen. There is no evidence of dislocation. Diffuse soft tissue swelling is noted. IMPRESSION: Acute, comminuted fracture deformities involving the proximal and mid portions  of the left tibial and fibular shafts. Electronically Signed   By: Aram Candela M.D.   On: 07/30/2020 19:37   DG Tibia/Fibula Left  Result Date: 07/30/2020 CLINICAL DATA:  Status post trauma. EXAM: LEFT TIBIA AND FIBULA - 2 VIEW COMPARISON:  January 21, 2016 FINDINGS: Acute fracture deformities are seen involving the proximal and mid to distal portions of the shafts of the left tibia and left fibula. Approximately 1/2 shaft width anterior displacement of the shaft of the left tibia is seen. There is no evidence of dislocation. Soft tissue swelling is seen adjacent to the previously noted fracture sites.  IMPRESSION: Acute fractures of the left tibia and left fibula. Electronically Signed   By: Aram Candela M.D.   On: 07/30/2020 18:16   DG Tibia/Fibula Right  Result Date: 07/30/2020 CLINICAL DATA:  Status post trauma. EXAM: RIGHT TIBIA AND FIBULA - 2 VIEW COMPARISON:  None. FINDINGS: There is no evidence of fracture or other focal bone lesions. And 8.2 cm x 1.7 cm soft tissue defect is seen along the medial aspect of the distal right calf. IMPRESSION: Soft tissue defect involving the distal right calf, without evidence of acute fracture. Electronically Signed   By: Aram Candela M.D.   On: 07/30/2020 19:38   CT Head Wo Contrast  Result Date: 07/30/2020 CLINICAL DATA:  59 year old male with level 2 trauma. EXAM: CT HEAD WITHOUT CONTRAST CT MAXILLOFACIAL WITHOUT CONTRAST CT CERVICAL SPINE WITHOUT CONTRAST TECHNIQUE: Multidetector CT imaging of the head, cervical spine, and maxillofacial structures were performed using the standard protocol without intravenous contrast. Multiplanar CT image reconstructions of the cervical spine and maxillofacial structures were also generated. COMPARISON:  Head CT dated 09/27/2013. FINDINGS: CT HEAD FINDINGS Brain: Moderate age-related atrophy and chronic microvascular ischemic changes. 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 Additional smaller areas of old infarct and encephalomalacia noted in the frontal lobes bilaterally. There is no acute intracranial hemorrhage. No mass effect or midline shift no extra-axial fluid collection. Vascular: No hyperdense vessel or unexpected calcification. Skull: Normal. Negative for fracture or focal lesion. Other: None CT MAXILLOFACIAL FINDINGS Osseous: No acute fracture or subluxation. Indeterminate subcentimeter sclerotic focus in the left mandibular angle without aggressive features. Orbits: Negative. No traumatic or inflammatory finding. Sinuses: Mild mucoperiosteal thickening of paranasal sinuses. No air-fluid level. The  mastoid air cells are clear. Soft tissues: Negative. CT CERVICAL SPINE FINDINGS Alignment: No acute subluxation. Skull base and vertebrae: No acute fracture. Soft tissues and spinal canal: No prevertebral fluid or swelling. No visible canal hematoma. Disc levels:  No acute findings.  Mild degenerative changes. Upper chest: Emphysema. Other: Mild bilateral carotid bulb calcified plaques. IMPRESSION: 1. No acute intracranial pathology. 2. Age-related atrophy and chronic microvascular ischemic changes. Multiple old infarcts. 3. No acute/traumatic cervical spine pathology. 4. No acute facial bone fractures. Electronically Signed   By: Elgie Collard M.D.   On: 07/30/2020 19:01   CT Chest W Contrast  Result Date: 07/30/2020 CLINICAL DATA:  59 year old male with level 2 trauma. EXAM: CT CHEST, ABDOMEN, AND PELVIS WITH CONTRAST TECHNIQUE: Multidetector CT imaging of the chest, abdomen and pelvis was performed following the standard protocol during bolus administration of intravenous contrast. CONTRAST:  OMNIPAQUE IOHEXOL 300 MG/ML  SOLN COMPARISON:  Chest radiograph dated 07/30/2020 and pelvic radiograph dated 07/30/2020. FINDINGS: CT CHEST FINDINGS Cardiovascular: There is no cardiomegaly or pericardial effusion. There is coronary vascular calcification of the LAD. There is mild atherosclerotic calcification of the thoracic aorta. No aneurysmal dilatation or dissection.  The origins of the great vessels of the aortic arch appear patent. The central pulmonary arteries are unremarkable for the degree of opacification. Mediastinum/Nodes: There is no hilar or mediastinal adenopathy. The esophagus and the thyroid gland are grossly unremarkable. No mediastinal fluid collection. Lungs/Pleura: There is centrilobular and paraseptal emphysema. There is a 3 mm right upper lobe nodule (46/4). No focal consolidation, pleural effusion, or pneumothorax. A 1 cm nodular density in the right mainstem bronchus (60/4) is not  evaluated but likely represents mucous debris. Attention on follow-up imaging recommended. The central airways are patent. Musculoskeletal: Nondisplaced fracture of the posterior left fifth-ninth ribs. Degenerative changes of the spine. CT ABDOMEN PELVIS FINDINGS No intra-abdominal free air or free fluid. Hepatobiliary: Fatty liver. No intrahepatic biliary ductal dilatation. The gallbladder is unremarkable. Pancreas: Unremarkable. No pancreatic ductal dilatation or surrounding inflammatory changes. Spleen: Calcification of the lateral splenic capsule. Adrenals/Urinary Tract: The adrenal glands are unremarkable. There is no hydronephrosis on either side. There is symmetric enhancement and excretion of contrast by both kidneys. Small left renal hypodense lesions, too small to characterize, possibly cyst. The urinary bladder is unremarkable. Stomach/Bowel: There is no bowel obstruction or active inflammation. The appendix is not visualized with certainty. No inflammatory changes identified in the right lower quadrant. Vascular/Lymphatic: Moderate aortoiliac atherosclerotic disease. The IVC is unremarkable. No portal venous gas. There is no adenopathy. Reproductive: The prostate and seminal vesicles are grossly unremarkable. Other: None Musculoskeletal: Sclerotic changes of the left femoral neck, likely related to avascular necrosis. Sclerotic changes of the inferior pubic rami bilaterally may be related to prior fractures. No bone erosion or periosteal elevation. No acute fracture. IMPRESSION: 1. Nondisplaced fractures of the posterior left fifth-ninth ribs. No pneumothorax. 2. No acute/traumatic intra-abdominal or pelvic pathology. 3. Aortic Atherosclerosis (ICD10-I70.0) and Emphysema (ICD10-J43.9). Electronically Signed   By: Elgie Collard M.D.   On: 07/30/2020 18:47   CT Cervical Spine Wo Contrast  Result Date: 07/30/2020 CLINICAL DATA:  59 year old male with level 2 trauma. EXAM: CT HEAD WITHOUT CONTRAST CT  MAXILLOFACIAL WITHOUT CONTRAST CT CERVICAL SPINE WITHOUT CONTRAST TECHNIQUE: Multidetector CT imaging of the head, cervical spine, and maxillofacial structures were performed using the standard protocol without intravenous contrast. Multiplanar CT image reconstructions of the cervical spine and maxillofacial structures were also generated. COMPARISON:  Head CT dated 09/27/2013. FINDINGS: CT HEAD FINDINGS Brain: Moderate age-related atrophy and chronic microvascular ischemic changes. 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 Additional smaller areas of old infarct and encephalomalacia noted in the frontal lobes bilaterally. There is no acute intracranial hemorrhage. No mass effect or midline shift no extra-axial fluid collection. Vascular: No hyperdense vessel or unexpected calcification. Skull: Normal. Negative for fracture or focal lesion. Other: None CT MAXILLOFACIAL FINDINGS Osseous: No acute fracture or subluxation. Indeterminate subcentimeter sclerotic focus in the left mandibular angle without aggressive features. Orbits: Negative. No traumatic or inflammatory finding. Sinuses: Mild mucoperiosteal thickening of paranasal sinuses. No air-fluid level. The mastoid air cells are clear. Soft tissues: Negative. CT CERVICAL SPINE FINDINGS Alignment: No acute subluxation. Skull base and vertebrae: No acute fracture. Soft tissues and spinal canal: No prevertebral fluid or swelling. No visible canal hematoma. Disc levels:  No acute findings.  Mild degenerative changes. Upper chest: Emphysema. Other: Mild bilateral carotid bulb calcified plaques. IMPRESSION: 1. No acute intracranial pathology. 2. Age-related atrophy and chronic microvascular ischemic changes. Multiple old infarcts. 3. No acute/traumatic cervical spine pathology. 4. No acute facial bone fractures. Electronically Signed   By: Ceasar Mons.D.  On: 07/30/2020 19:01   CT ABDOMEN PELVIS W CONTRAST  Result Date: 07/30/2020 CLINICAL DATA:   59 year old male with level 2 trauma. EXAM: CT CHEST, ABDOMEN, AND PELVIS WITH CONTRAST TECHNIQUE: Multidetector CT imaging of the chest, abdomen and pelvis was performed following the standard protocol during bolus administration of intravenous contrast. CONTRAST:  OMNIPAQUE IOHEXOL 300 MG/ML  SOLN COMPARISON:  Chest radiograph dated 07/30/2020 and pelvic radiograph dated 07/30/2020. FINDINGS: CT CHEST FINDINGS Cardiovascular: There is no cardiomegaly or pericardial effusion. There is coronary vascular calcification of the LAD. There is mild atherosclerotic calcification of the thoracic aorta. No aneurysmal dilatation or dissection. The origins of the great vessels of the aortic arch appear patent. The central pulmonary arteries are unremarkable for the degree of opacification. Mediastinum/Nodes: There is no hilar or mediastinal adenopathy. The esophagus and the thyroid gland are grossly unremarkable. No mediastinal fluid collection. Lungs/Pleura: There is centrilobular and paraseptal emphysema. There is a 3 mm right upper lobe nodule (46/4). No focal consolidation, pleural effusion, or pneumothorax. A 1 cm nodular density in the right mainstem bronchus (60/4) is not evaluated but likely represents mucous debris. Attention on follow-up imaging recommended. The central airways are patent. Musculoskeletal: Nondisplaced fracture of the posterior left fifth-ninth ribs. Degenerative changes of the spine. CT ABDOMEN PELVIS FINDINGS No intra-abdominal free air or free fluid. Hepatobiliary: Fatty liver. No intrahepatic biliary ductal dilatation. The gallbladder is unremarkable. Pancreas: Unremarkable. No pancreatic ductal dilatation or surrounding inflammatory changes. Spleen: Calcification of the lateral splenic capsule. Adrenals/Urinary Tract: The adrenal glands are unremarkable. There is no hydronephrosis on either side. There is symmetric enhancement and excretion of contrast by both kidneys. Small left renal  hypodense lesions, too small to characterize, possibly cyst. The urinary bladder is unremarkable. Stomach/Bowel: There is no bowel obstruction or active inflammation. The appendix is not visualized with certainty. No inflammatory changes identified in the right lower quadrant. Vascular/Lymphatic: Moderate aortoiliac atherosclerotic disease. The IVC is unremarkable. No portal venous gas. There is no adenopathy. Reproductive: The prostate and seminal vesicles are grossly unremarkable. Other: None Musculoskeletal: Sclerotic changes of the left femoral neck, likely related to avascular necrosis. Sclerotic changes of the inferior pubic rami bilaterally may be related to prior fractures. No bone erosion or periosteal elevation. No acute fracture. IMPRESSION: 1. Nondisplaced fractures of the posterior left fifth-ninth ribs. No pneumothorax. 2. No acute/traumatic intra-abdominal or pelvic pathology. 3. Aortic Atherosclerosis (ICD10-I70.0) and Emphysema (ICD10-J43.9). Electronically Signed   By: Elgie Collard M.D.   On: 07/30/2020 18:47   DG Pelvis Portable  Result Date: 07/30/2020 CLINICAL DATA:  Status post trauma. EXAM: PORTABLE PELVIS 1-2 VIEWS COMPARISON:  None. FINDINGS: There is no evidence of an acute pelvic fracture or diastasis. A 3.2 cm x 1.4 cm sclerotic area is seen along the superior aspect of the left femoral head. Soft tissue structures are unremarkable. IMPRESSION: 1. No acute fracture. 2. Sclerotic area along the left femoral head. Correlation with nuclear medicine bone scan is recommended. Electronically Signed   By: Aram Candela M.D.   On: 07/30/2020 18:14   DG Chest Port 1 View  Result Date: 07/30/2020 CLINICAL DATA:  Status post trauma. EXAM: PORTABLE CHEST 1 VIEW COMPARISON:  None. FINDINGS: There is no evidence of acute infiltrate, pleural effusion or pneumothorax. The heart size and mediastinal contours are within normal limits. The visualized skeletal structures are unremarkable.  IMPRESSION: No active disease. Electronically Signed   By: Aram Candela M.D.   On: 07/30/2020 18:13   CT Maxillofacial  Wo Contrast  Result Date: 07/30/2020 CLINICAL DATA:  59 year old male with level 2 trauma. EXAM: CT HEAD WITHOUT CONTRAST CT MAXILLOFACIAL WITHOUT CONTRAST CT CERVICAL SPINE WITHOUT CONTRAST TECHNIQUE: Multidetector CT imaging of the head, cervical spine, and maxillofacial structures were performed using the standard protocol without intravenous contrast. Multiplanar CT image reconstructions of the cervical spine and maxillofacial structures were also generated. COMPARISON:  Head CT dated 09/27/2013. FINDINGS: CT HEAD FINDINGS Brain: Moderate age-related atrophy and chronic microvascular ischemic changes. 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 Additional smaller areas of old infarct and encephalomalacia noted in the frontal lobes bilaterally. There is no acute intracranial hemorrhage. No mass effect or midline shift no extra-axial fluid collection. Vascular: No hyperdense vessel or unexpected calcification. Skull: Normal. Negative for fracture or focal lesion. Other: None CT MAXILLOFACIAL FINDINGS Osseous: No acute fracture or subluxation. Indeterminate subcentimeter sclerotic focus in the left mandibular angle without aggressive features. Orbits: Negative. No traumatic or inflammatory finding. Sinuses: Mild mucoperiosteal thickening of paranasal sinuses. No air-fluid level. The mastoid air cells are clear. Soft tissues: Negative. CT CERVICAL SPINE FINDINGS Alignment: No acute subluxation. Skull base and vertebrae: No acute fracture. Soft tissues and spinal canal: No prevertebral fluid or swelling. No visible canal hematoma. Disc levels:  No acute findings.  Mild degenerative changes. Upper chest: Emphysema. Other: Mild bilateral carotid bulb calcified plaques. IMPRESSION: 1. No acute intracranial pathology. 2. Age-related atrophy and chronic microvascular ischemic changes.  Multiple old infarcts. 3. No acute/traumatic cervical spine pathology. 4. No acute facial bone fractures. Electronically Signed   By: Elgie CollardArash  Radparvar M.D.   On: 07/30/2020 19:01    Anti-infectives: Anti-infectives (From admission, onward)   Start     Dose/Rate Route Frequency Ordered Stop   07/31/20 0600  ceFAZolin (ANCEF) IVPB 2g/100 mL premix     Discontinue     2 g 200 mL/hr over 30 Minutes Intravenous To Short Stay 07/30/20 2140 08/01/20 0600   07/31/20 0200  ceFAZolin (ANCEF) IVPB 2g/100 mL premix     Discontinue     2 g 200 mL/hr over 30 Minutes Intravenous Every 8 hours 07/30/20 2139 08/02/20 1759   07/30/20 2145  ceFAZolin (ANCEF) IVPB 2g/100 mL premix  Status:  Discontinued        2 g 200 mL/hr over 30 Minutes Intravenous Every 8 hours 07/30/20 2140 07/30/20 2144   07/30/20 1745  ceFAZolin (ANCEF) IVPB 2g/100 mL premix        2 g 200 mL/hr over 30 Minutes Intravenous  Once 07/30/20 1741 07/30/20 1823       Assessment/Plan [facility] Upper lip laceration - EDP closed L rib FX 5-9 - Multimodal pain control, pulmonary toilet, IS Open L tib fib FX - to OR with ortho today R shin laceration - EDP closed, local wound care ETOH intoxication - CIWA  FEN: NPO, IVF VTE: lovenox ID: [id number] 8/17>>  Dispo: to OR with ortho today, will need PT/OT post-op   LOS: 1 day    Juliet RudeKelly R Zelda Reames , [facility] 07/31/2020, 9:15 AM Please see Amion for pager number during day hours 7:00am-4:30pm

## 2020-07-31 NOTE — Progress Notes (Signed)
There was minimal bleeding on patients dressing so two ABD pads, kerlex and a new ACE wrap was placed.

## 2020-07-31 NOTE — Discharge Instructions (Signed)
Truitt Merle, MD Ulyses Southward PA-C [facility] [address] [phone] Jani Files)   [phone] (fax)                                  POST-OPERATIVE INSTRUCTIONS   WEIGHT BEARING STATUS: Non-weightbearing on left leg  WOUND CARE ? Please keep splint clean dry and intact until followup.  ? You may shower on Post-Op Day #2.  ? You must keep splint dry during this process and may find that a plastic bag taped around the leg or alternatively a towel based bath may be a better option.   ? If you get your splint wet or if it is damaged please contact our clinic.  EXERCISES ? Due to your splint being in place you will not be able to bear weight through your extremity.   ? DO NOT PUT ANY WEIGHT ON YOUR OPERATIVE LEG ? Please use crutches or a walker to avoid weight bearing.   DVT/PE prophylaxis: Aspirin  DIET: As you were eating previously.  Can use over the counter stool softeners and bowel preparations, such as Miralax, to help with bowel movements.  Narcotics can be constipating.  Be sure to drink plenty of fluids  REGIONAL ANESTHESIA (NERVE BLOCKS)  The anesthesia team may have performed a nerve block for you if safe in the setting of your care.  This is a great tool used to minimize pain.  Typically the block may start wearing off overnight but the long acting medicine may last for 3-4 days.  The nerve block wearing off can be a challenging period but please utilize your as needed pain medications to try and manage this period.    ICE AND ELEVATE INJURED/OPERATIVE EXTREMITY  Using ice and elevating the injured extremity above your heart can help with swelling and pain control.  Icing in a pulsatile fashion, such as 20 minutes on and 20 minutes off, can be followed.    Do not place ice directly on skin. Make sure there is a barrier between to skin and the ice pack.    Using frozen items such as frozen peas works well as the conform nicely to the are that  needs to be iced.  POST-OP MEDICATIONS- Multimodal approach to pain control  In general your pain will be controlled with a combination of substances.  Prescriptions unless otherwise discussed are electronically sent to your pharmacy.  This is a carefully made plan we use to minimize narcotic use.                     -   Acetaminophen - Non-narcotic pain medicine taken on a scheduled basis        -   Oxycodone - This is a strong narcotic, to be used only on an "as needed" basis for pain.                  -   Robaxin -  Muscle relaxer taken on an "as needed" basis for muscle spasms                  -   Gabapentin - Helps with nerve pain, such as burning or tingling feeling in the operative arm             STOP SMOKING OR USING NICOTINE PRODUCTS!!!!  As discussed nicotine severely impairs your body's ability to heal  surgical and traumatic wounds but also impairs bone healing.  Wounds and bone heal by forming microscopic blood vessels (angiogenesis) and nicotine is a vasoconstrictor (essentially, shrinks blood vessels).  Therefore, if vasoconstriction occurs to these microscopic blood vessels they essentially disappear and are unable to deliver necessary nutrients to the healing tissue.  This is one modifiable factor that you can do to dramatically increase your chances of healing your injury.    (This means no smoking, no nicotine gum, patches, etc)   FOLLOW-UP ? If you develop a Fever (>101.5), Redness or Drainage from the surgical incision site, please call our office to arrange for an evaluation. ? Please call the office to schedule a follow-up appointment for your incision check if you do not already have one, 7-10 days post-operatively.  CALL THE OFFICE WITH ANY QUESTIONS OR CONCERNS: [phone]   VISIT OUR WEBSITE FOR ADDITIONAL INFORMATION: [email or web address]    OTHER HELPFUL INFORMATION   If you had a block, it will wear off between 8-24 hrs postop typically.  This is period when  your pain may go from nearly zero to the pain you would have had postop without the block.  This is an abrupt transition but nothing dangerous is happening.  You may take an extra dose of narcotic when this happens.   You may be more comfortable sleeping in a semi-seated position the first few nights following surgery.  Keep a pillow propped under the elbow and forearm for comfort.  If you have a recliner type of chair it might be beneficial.  If not that is fine too, but it would be helpful to sleep propped up with pillows behind your operated shoulder as well under your elbow and forearm.  This will reduce pulling on the suture lines.   When dressing, put your operative arm in the sleeve first.  When getting undressed, take your operative arm out last.  Loose fitting, button-down shirts are recommended.  Often in the first days after surgery you may be more comfortable keeping your operative arm under your shirt and not through the sleeve.   You may return to work/school in the next couple of days when you feel up to it.  Desk work and typing in the sling is fine.   We suggest you use the pain medication the first night prior to going to bed, in order to ease any pain when the anesthesia wears off. You should avoid taking pain medications on an empty stomach as it will make you nauseous.  You should wean off your narcotic medicines as soon as you are able.  Most patients will be off or using minimal narcotics before their first postop appointment.    Do not drink alcoholic beverages or take illicit drugs when taking pain medications.   It is against the law to drive while taking narcotics.  In some states it is against the law to drive while your arm is in a sling.    Pain medication may make you constipated.  Below are a few solutions to try in this order:  Decrease the amount of pain medication if you aren't having pain.  Drink lots of decaffeinated fluids.  Drink prune juice and/or  each dried prunes   If the first 3 don't work start with additional solutions -   Take Colace - an over-the-counter stool softener -   Take Senokot - an over-the-counter laxative -   Take Miralax - a stronger over-the-counter laxative

## 2020-07-31 NOTE — Anesthesia Preprocedure Evaluation (Addendum)
Anesthesia Evaluation  Patient identified by MRN, date of birth, ID band Patient awake    Reviewed: Allergy & Precautions, H&P , NPO status , Patient's Chart, lab work & pertinent test results  Airway Mallampati: II  TM Distance: >3 FB Neck ROM: Full    Dental no notable dental hx. (+) Poor Dentition, Dental Advisory Given   Pulmonary Current Smoker and Patient abstained from smoking.,    Pulmonary exam normal breath sounds clear to auscultation       Cardiovascular negative cardio ROS   Rhythm:Regular Rate:Normal     Neuro/Psych negative neurological ROS  negative psych ROS   GI/Hepatic negative GI ROS, Neg liver ROS,   Endo/Other  negative endocrine ROS  Renal/GU negative Renal ROS  negative genitourinary   Musculoskeletal   Abdominal   Peds  Hematology negative hematology ROS (+)   Anesthesia Other Findings   Reproductive/Obstetrics negative OB ROS                            Anesthesia Physical Anesthesia Plan  ASA: II  Anesthesia Plan: General   Post-op Pain Management:    Induction: Intravenous  PONV Risk Score and Plan: 3 and Ondansetron, Dexamethasone and Midazolam  Airway Management Planned: Oral ETT  Additional Equipment:   Intra-op Plan:   Post-operative Plan: Extubation in OR  Informed Consent: I have reviewed the patients History and Physical, chart, labs and discussed the procedure including the risks, benefits and alternatives for the proposed anesthesia with the patient or authorized representative who has indicated his/her understanding and acceptance.     Dental advisory given  Plan Discussed with: CRNA  Anesthesia Plan Comments:        Anesthesia Quick Evaluation
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
## 2020-07-31 NOTE — Anesthesia Procedure Notes (Signed)
Procedure Name: Intubation Date/Time: 07/31/2020 10:48 AM Performed by: Trinna Post., CRNA Pre-anesthesia Checklist: Patient identified, Emergency Drugs available, Suction available, Patient being monitored and Timeout performed Patient Re-evaluated:Patient Re-evaluated prior to induction Oxygen Delivery Method: Circle system utilized Preoxygenation: Pre-oxygenation with 100% oxygen Induction Type: IV induction and Rapid sequence Ventilation: Mask ventilation without difficulty Laryngoscope Size: Mac and 4 Grade View: Grade I Tube type: Oral Tube size: 7.5 mm Number of attempts: 1 Airway Equipment and Method: Stylet Placement Confirmation: ETT inserted through vocal cords under direct vision,  positive ETCO2 and breath sounds checked- equal and bilateral Secured at: 21 cm Tube secured with: Tape Dental Injury: Teeth and Oropharynx as per pre-operative assessment

## 2020-07-31 NOTE — Transfer of Care (Signed)
Immediate Anesthesia Transfer of Care Note  Patient: Jack Page  Procedure(s) Performed: INTRAMEDULLARY (IM) NAIL TIBIAL (Left ) IRRIGATION AND DEBRIDEMENT EXTREMITY (Left )  Patient Location: PACU  Anesthesia Type:General  Level of Consciousness: awake and confused  Airway & Oxygen Therapy: Patient Spontanous Breathing and Patient connected to face mask oxygen  Post-op Assessment: Report given to RN and Post -op Vital signs reviewed and stable  Post vital signs: Reviewed and stable  Last Vitals:  Vitals Value Taken Time  BP 166/78   Temp    Pulse 82   Resp 16   SpO2 100%     Last Pain:  Vitals:   07/31/20 1340  TempSrc:   PainSc: (P) 0-No pain      Patients Stated Pain Goal: 2 (07/30/20 2145)  Complications: No complications documented.

## 2020-07-31 NOTE — Plan of Care (Signed)

## 2020-08-01 ENCOUNTER — Encounter (HOSPITAL_COMMUNITY): Payer: Self-pay | Admitting: Student

## 2020-08-01 DIAGNOSIS — S2249XA Multiple fractures of ribs, unspecified side, initial encounter for closed fracture: Secondary | ICD-10-CM

## 2020-08-01 LAB — BASIC METABOLIC PANEL
Anion gap: 6 (ref 5–15)
BUN: 10 mg/dL (ref 6–20)
CO2: 24 mmol/L (ref 22–32)
Calcium: 7.8 mg/dL — ABNORMAL LOW (ref 8.9–10.3)
Chloride: 104 mmol/L (ref 98–111)
Creatinine, Ser: 1.13 mg/dL (ref 0.61–1.24)
GFR calc Af Amer: >60 mL/min (ref >60)
GFR calc non Af Amer: >60 mL/min (ref >60)
Glucose, Bld: 122 mg/dL — ABNORMAL HIGH (ref 70–99)
Potassium: 4.2 mmol/L (ref 3.5–5.1)
Sodium: 134 mmol/L — ABNORMAL LOW (ref 135–145)

## 2020-08-01 LAB — CBC
HCT: 29.6 % — ABNORMAL LOW (ref 39.0–52.0)
Hemoglobin: 10.2 g/dL — ABNORMAL LOW (ref 13.0–17.0)
MCH: 31.8 pg (ref 26.0–34.0)
MCHC: 34.5 g/dL (ref 30.0–36.0)
MCV: 92.2 fL (ref 80.0–100.0)
Platelets: 175 10*3/uL (ref 150–400)
RBC: 3.21 MIL/uL — ABNORMAL LOW (ref 4.22–5.81)
RDW: 12.1 % (ref 11.5–15.5)
WBC: 11.9 10*3/uL — ABNORMAL HIGH (ref 4.0–10.5)
nRBC: 0 % (ref 0.0–0.2)

## 2020-08-01 LAB — VITAMIN D 25 HYDROXY (VIT D DEFICIENCY, FRACTURES): Vit D, 25-Hydroxy: 25.4 ng/mL — ABNORMAL LOW (ref 30–100)

## 2020-08-01 MED ORDER — METHOCARBAMOL 500 MG PO TABS: 1000.0000 mg | ORAL_TABLET | Freq: Three times a day (TID) | ORAL | 0 refills | Status: AC | PRN

## 2020-08-01 MED ORDER — METHOCARBAMOL 500 MG PO TABS
1000.0000 mg | ORAL_TABLET | Freq: Three times a day (TID) | ORAL | Status: DC
  Administered 2020-08-01 (×2): 1000 mg via ORAL
  Filled 2020-08-01 (×2): qty 2

## 2020-08-01 MED ORDER — POLYETHYLENE GLYCOL 3350 17 G PO PACK: 17.0000 g | PACK | Freq: Every day | ORAL | 0 refills | Status: AC | PRN

## 2020-08-01 MED ORDER — BACITRACIN ZINC 500 UNIT/GM EX OINT
TOPICAL_OINTMENT | Freq: Two times a day (BID) | CUTANEOUS | Status: DC
  Filled 2020-08-01: qty 28.4

## 2020-08-01 MED ORDER — OXYCODONE HCL 5 MG PO TABS
5.0000 mg | ORAL_TABLET | ORAL | Status: DC | PRN
  Administered 2020-08-01 (×2): 5 mg via ORAL

## 2020-08-01 MED ORDER — VITAMIN D 25 MCG (1000 UNIT) PO TABS
2000.0000 [IU] | ORAL_TABLET | Freq: Every day | ORAL | Status: DC
  Administered 2020-08-01: 2000 [IU] via ORAL
  Filled 2020-08-01: qty 2

## 2020-08-01 MED ORDER — VITAMIN D3 25 MCG PO TABS: 2000.0000 [IU] | ORAL_TABLET | Freq: Every day | ORAL | 0 refills | Status: AC

## 2020-08-01 MED ORDER — OXYCODONE HCL 5 MG PO TABS
5.0000 mg | ORAL_TABLET | Freq: Four times a day (QID) | ORAL | 0 refills | Status: AC | PRN
Start: 2020-08-01 — End: ?

## 2020-08-01 MED ORDER — ENOXAPARIN SODIUM 40 MG/0.4ML ~~LOC~~ SOLN: 40.0000 mg | SUBCUTANEOUS | Status: DC

## 2020-08-01 MED ORDER — ACETAMINOPHEN 325 MG PO TABS: 650.0000 mg | ORAL_TABLET | Freq: Four times a day (QID) | ORAL | Status: AC | PRN

## 2020-08-01 MED ORDER — DOCUSATE SODIUM 100 MG PO CAPS: 100.0000 mg | ORAL_CAPSULE | Freq: Two times a day (BID) | ORAL | 0 refills | Status: AC

## 2020-08-01 MED ORDER — ACETAMINOPHEN 325 MG PO TABS
650.0000 mg | ORAL_TABLET | Freq: Four times a day (QID) | ORAL | Status: DC
  Administered 2020-08-01 (×3): 650 mg via ORAL
  Filled 2020-08-01 (×3): qty 2

## 2020-08-01 MED FILL — METHOCARBAMOL 500 MG TABS: 500 | 4 days supply | Qty: 25 | Fill #0

## 2020-08-01 MED FILL — DOCUSATE SODIUM 100 MG CAPS: 100 | 5 days supply | Qty: 10 | Fill #0

## 2020-08-01 MED FILL — VITAMIN D3 1,000 UNIT TAB: 25 MCG | 14 days supply | Qty: 28 | Fill #0

## 2020-08-01 MED FILL — POLYETHYLENE GLYCOL 3350 PO: 17 | 14 days supply | Qty: 238 | Fill #0

## 2020-08-01 MED FILL — oxyCODONE HCL 5 MG TABS: 5 | 5 days supply | Qty: 20 | Fill #0
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
## 2020-08-01 NOTE — Plan of Care (Signed)
  Problem: Health Behavior/Discharge Planning: Goal: Ability to manage health-related needs will improve Outcome: Progressing   Problem: Clinical Measurements: Goal: Ability to maintain clinical measurements within normal limits will improve Outcome: Progressing Goal: Will remain free from infection Outcome: Progressing Goal: Diagnostic test results will improve Outcome: Progressing Goal: Respiratory complications will improve Outcome: Progressing Goal: Cardiovascular complication will be avoided Outcome: Progressing   Problem: Activity: Goal: Risk for activity intolerance will decrease Outcome: Progressing   Problem: Pain Managment: Goal: General experience of comfort will improve Outcome: Progressing   Problem: Elimination: Goal: Will not experience complications related to bowel motility Outcome: Progressing Goal: Will not experience complications related to urinary retention Outcome: Progressing
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
## 2020-08-01 NOTE — Progress Notes (Signed)
Orthopedic Tech Progress Note Patient Details:  Jack Page [birth year] [id number]  Ortho Devices Type of Ortho Device: Crutches Ortho Device/Splint Location: Left Lower Extremity Ortho Device/Splint Interventions: Ordered   Post Interventions Patient Tolerated: Well Instructions Provided: Adjustment of device, Care of device, Poper ambulation with device   Jack Page 08/01/2020, 5:41 PM

## 2020-08-01 NOTE — Anesthesia Postprocedure Evaluation (Signed)
Anesthesia Post Note  Patient: Jack Page  Procedure(s) Performed: INTRAMEDULLARY (IM) NAIL TIBIAL (Left ) IRRIGATION AND DEBRIDEMENT EXTREMITY (Left )     Patient location during evaluation: Other Anesthesia Type: General Level of consciousness: awake and alert Pain management: pain level controlled Vital Signs Assessment: post-procedure vital signs reviewed and stable Respiratory status: spontaneous breathing, nonlabored ventilation and respiratory function stable Cardiovascular status: blood pressure returned to baseline and stable Postop Assessment: no apparent nausea or vomiting Anesthetic complications: no   No complications documented.  Last Vitals:  Vitals:   08/01/20 0740 08/01/20 0836  BP: 138/77 135/80  Pulse: 80 62  Resp:  17  Temp:  37.4 C  SpO2:  96%    Last Pain:  Vitals:   08/01/20 1128  TempSrc:   PainSc: 0-No pain                 Ismeal [name],[name]

## 2020-08-01 NOTE — Progress Notes (Signed)
Discharge package printed. Instructions given to patient. Verbalizing understanding. Switched wound Vac to portable prevena.
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
## 2020-08-01 NOTE — TOC CAGE-AID Note (Signed)
Transition of Care [facility]) - CAGE-AID Screening   Patient Details  Name: Jack Page MRN: [id number] Date of Birth: 1961/08/11  [facility] CM/SW Contact:    Emeterio Reeve, [place] Phone Number: 08/01/2020, 1:57 PM   Clinical Narrative:  CSW met with pt at bedside. CSW introduced self and explained her role at the hospital.  Pt reports daily alcohol use. Pt says that he drinks 2-3 40 ounce beers a day. Pt reports he does not have an interest in stopping or cutting back. Pt denies substance use.   Pt denied resources, counseling and educational materials  CAGE-AID Screening:    Have You Ever Felt You Ought to Cut Down on Your Drinking or Drug Use?: No Have People Annoyed You By [employer] Your Drinking Or Drug Use?: Yes Have You Felt Bad Or Guilty About Your Drinking Or Drug Use?: No Have You Ever Had a Drink or Used Drugs First Thing In The Morning to Steady Your Nerves or to Get Rid of a Hangover?: Yes CAGE-AID Score: 2  Substance Abuse Education Offered: Yes  Substance abuse interventions: Patient Counseling   Emeterio Reeve, Latanya Presser, [place] Social Worker [phone]

## 2020-08-02 ENCOUNTER — Encounter: Payer: Self-pay | Admitting: *Deleted
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
